# Patient Record
Sex: Male | Born: 1965 | Hispanic: No | Smoking: Never smoker
Health system: Southern US, Community
[De-identification: ages and names within clinical notes are randomized; demographics above are authoritative.]

## PROBLEM LIST (undated history)

## (undated) DIAGNOSIS — I10 Essential (primary) hypertension: Secondary | ICD-10-CM

## (undated) DIAGNOSIS — M1990 Unspecified osteoarthritis, unspecified site: Secondary | ICD-10-CM

## (undated) DIAGNOSIS — G4730 Sleep apnea, unspecified: Secondary | ICD-10-CM

## (undated) DIAGNOSIS — J45909 Unspecified asthma, uncomplicated: Secondary | ICD-10-CM

## (undated) DIAGNOSIS — K219 Gastro-esophageal reflux disease without esophagitis: Secondary | ICD-10-CM

## (undated) DIAGNOSIS — C801 Malignant (primary) neoplasm, unspecified: Secondary | ICD-10-CM

## (undated) DIAGNOSIS — E785 Hyperlipidemia, unspecified: Secondary | ICD-10-CM

## (undated) DIAGNOSIS — N189 Chronic kidney disease, unspecified: Secondary | ICD-10-CM

## (undated) HISTORY — PX: CERVICAL DISC ARTHROPLASTY: SHX587

## (undated) HISTORY — DX: Unspecified asthma, uncomplicated: J45.909

## (undated) HISTORY — DX: Chronic kidney disease, unspecified: N18.9

## (undated) HISTORY — DX: Malignant (primary) neoplasm, unspecified: C80.1

## (undated) HISTORY — PX: KNEE SURGERY: SHX244

## (undated) HISTORY — DX: Hyperlipidemia, unspecified: E78.5

## (undated) HISTORY — PX: OTHER SURGICAL HISTORY: SHX169

## (undated) HISTORY — PX: CHOLECYSTECTOMY: SHX55

---

## 2014-08-25 HISTORY — PX: ESOPHAGOGASTRODUODENOSCOPY: SHX1529

## 2017-01-27 ENCOUNTER — Other Ambulatory Visit: Payer: Self-pay | Admitting: Neurosurgery

## 2017-01-27 DIAGNOSIS — M542 Cervicalgia: Secondary | ICD-10-CM

## 2017-02-03 HISTORY — PX: COLONOSCOPY: SHX174

## 2017-02-05 ENCOUNTER — Inpatient Hospital Stay
Admission: RE | Admit: 2017-02-05 | Discharge: 2017-02-05 | Disposition: A | Discharge status: Home / Self Care | Payer: Self-pay | Source: Ambulatory Visit | Attending: Neurosurgery | Admitting: Neurosurgery

## 2017-06-25 ENCOUNTER — Ambulatory Visit: Payer: Self-pay

## 2017-06-25 ENCOUNTER — Other Ambulatory Visit: Payer: Self-pay | Admitting: Family Medicine

## 2017-06-25 DIAGNOSIS — M545 Low back pain: Secondary | ICD-10-CM

## 2017-07-29 ENCOUNTER — Encounter (INDEPENDENT_AMBULATORY_CARE_PROVIDER_SITE_OTHER): Payer: Self-pay | Admitting: Orthopaedic Surgery

## 2017-07-29 ENCOUNTER — Ambulatory Visit (INDEPENDENT_AMBULATORY_CARE_PROVIDER_SITE_OTHER): Payer: No Typology Code available for payment source | Admitting: Orthopaedic Surgery

## 2017-07-29 VITALS — BP 140/86 | HR 89 | Ht 72.0 in | Wt 305.0 lb

## 2017-07-29 DIAGNOSIS — M545 Low back pain, unspecified: Secondary | ICD-10-CM

## 2017-07-30 ENCOUNTER — Encounter (INDEPENDENT_AMBULATORY_CARE_PROVIDER_SITE_OTHER): Payer: Self-pay | Admitting: Orthopaedic Surgery

## 2017-07-30 NOTE — Progress Notes (Signed)
Office Visit Note   Patient: Sean Townsend           Date of Birth: [birth year]           MRN: [id number] Visit Date: 07/29/2017              Requested by: No referring provider defined for this encounter. PCP: Ignatius Specking, MD   Assessment & Plan: Visit Diagnoses:  1. Acute right-sided low back pain without sciatica     Plan: We will set patient up for some physical therapy I plan to check him back in 6 to 8 weeks.  Continue working.  We discussed a walking program.  Work slip given for no work on 5/3 and 07/21/2017.  Follow-Up Instructions: No follow-ups on file.   Orders:  No orders of the defined types were placed in this encounter.  No orders of the defined types were placed in this encounter.     Procedures: No procedures performed   Clinical Data: No additional findings.   Subjective: Chief Complaint  Patient presents with  . Lower Back - Pain    HPI 52 year old male new back patient concerning a Worker's Comp. injury that occurred on 05/23/2017 at work on [facility] surgical center.  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  Burning pain since that time is continued to work.  He missed 2 days 5/3 and 07/21/2017.  He is taking Robaxin without relief.  Previous history of cervical disc degeneration with cervical disc arthroplasty which is done well.  He denies chills or fever.  Persistent back pain right leg pain.  He is ambulatory without any walking aids.  No associated bowel or bladder symptoms.  He describes the pain in his right leg as a burning type pain.  He has not been through any therapy.  Review of Systems previous C6-7 disc arthroplasty doing well.  Positive for hypertension negative for diabetes and smoking.  Positive for GERD.  Negative for CVA, MI.  Positive for seasonal allergies occasional use of asthma inhaler.  Rest of 14  point review of systems negative as it pertains to HPI.   Objective: Vital Signs: BP 140/86   Pulse 89   Ht 6' (1.829 m)   Wt (!) 305 lb (138.3 kg)   BMI 41.37 kg/m   Physical Exam  Constitutional: He is oriented to person, place, and time. He appears well-developed and well-nourished.  HENT:  Head: Normocephalic and atraumatic.  Eyes: Pupils are equal, round, and reactive to light. EOM are normal.  Neck: No tracheal deviation present. No thyromegaly present.  Cardiovascular: Normal rate.  Pulmonary/Chest: Effort normal. He has no wheezes.  Abdominal: Soft. Bowel sounds are normal.  Neurological: He is alert and oriented to person, place, and time.  Skin: Skin is warm and dry. Capillary refill takes less than 2 seconds.  Psychiatric: He has a normal mood and affect. His behavior is normal. Judgment and thought content normal.    Ortho Exam well-healed cervical disc arthroplasty incision anteriorly.  Upper and lower extremity reflexes are 3+ and symmetrical.  Anterior tib gastrocsoleus intact.  With normal heel and toe walking.  Some pain with straight leg raising on the right positive sciatic notch tenderness negative on the left.  No isolated motor weakness right or left lower extremity quads hamstrings ankle dorsiflexion plantarflexion EHL.  Specialty  Comments:  No specialty comments available.  Imaging: Lumbar x-rays 06/25/2017 showed minimal endplate spurring at L4-5 anteriorly.  No spondylolisthesis negative for fracture.  Disc space height was well-maintained.  Pars are intact.  No acute changes.    PMFS History: There are no active problems to display for this patient.  History reviewed. No pertinent past medical history.  History reviewed. No pertinent family history.  History reviewed. No pertinent surgical history. Social History   Occupational History  . Not on file  Tobacco Use  . Smoking status: Never Smoker  . Smokeless tobacco: Never Used  Substance and  Sexual Activity  . Alcohol use: Not on file  . Drug use: Not on file  . Sexual activity: Not on file

## 2017-09-04 ENCOUNTER — Telehealth (INDEPENDENT_AMBULATORY_CARE_PROVIDER_SITE_OTHER): Payer: Self-pay

## 2017-09-04 DIAGNOSIS — M545 Low back pain, unspecified: Secondary | ICD-10-CM

## 2017-09-04 NOTE — Telephone Encounter (Signed)
Received request for PT rx from [facility] but I didn't see one in chart. Needs to be faxed to [phone] with UJW#[id number] on cover

## 2017-09-05 NOTE — Telephone Encounter (Signed)
Referral entered and faxed

## 2017-09-05 NOTE — Addendum Note (Signed)
Addended by: Rogers SeedsYEATTS, Deira Shimer M on: 09/05/2017 09:07 AM   Modules accepted: Orders

## 2017-09-16 ENCOUNTER — Ambulatory Visit (INDEPENDENT_AMBULATORY_CARE_PROVIDER_SITE_OTHER): Payer: Self-pay | Admitting: Orthopaedic Surgery

## 2020-01-22 IMAGING — DX DG LUMBAR SPINE COMPLETE 4+V
5 series · 5 of 5 positions shown · non-contrast
Comparison: None.

CLINICAL DATA: Pain following lifting heavy object

EXAM:
LUMBAR SPINE - COMPLETE 4+ VIEW

[l-spine ap]
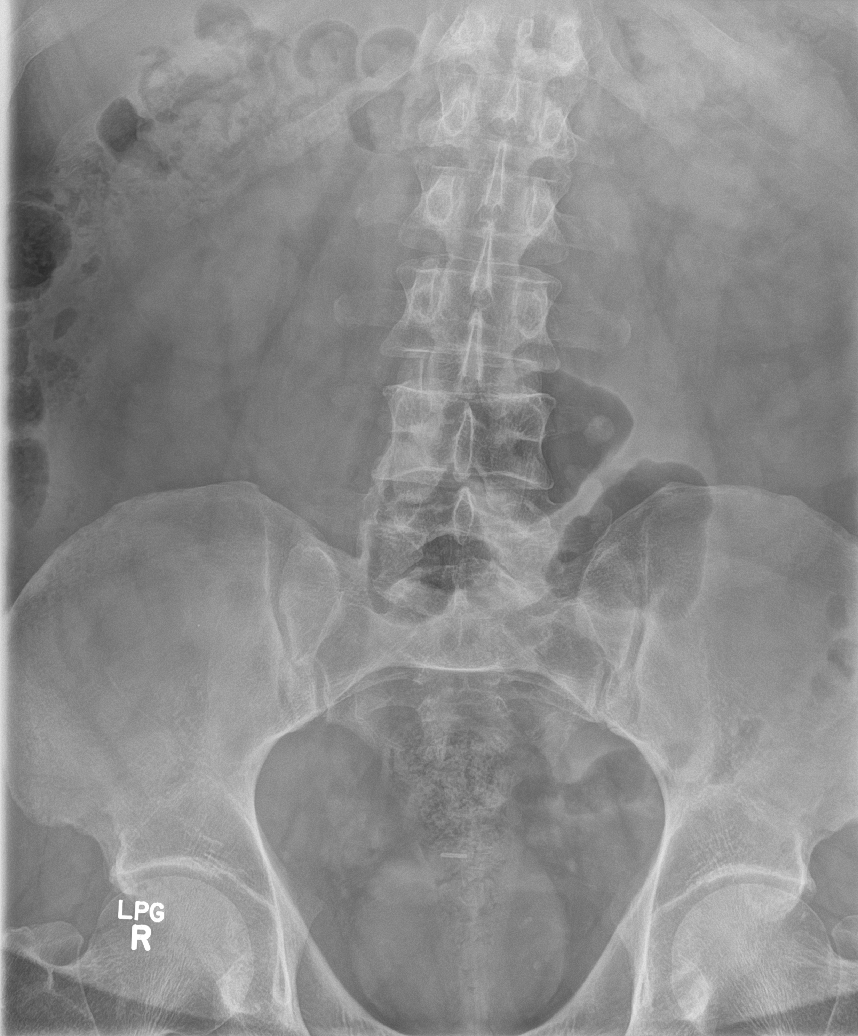

[l-spine obl (1 of 2)]
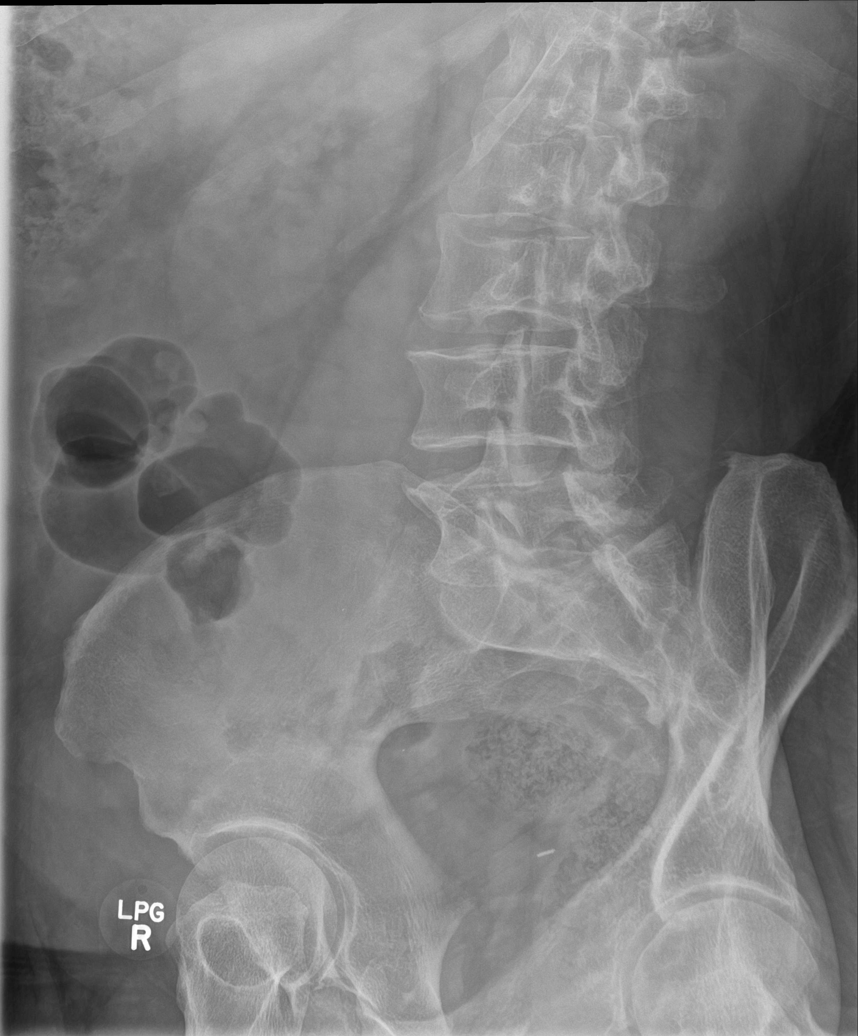

[l-spine obl (2 of 2)]
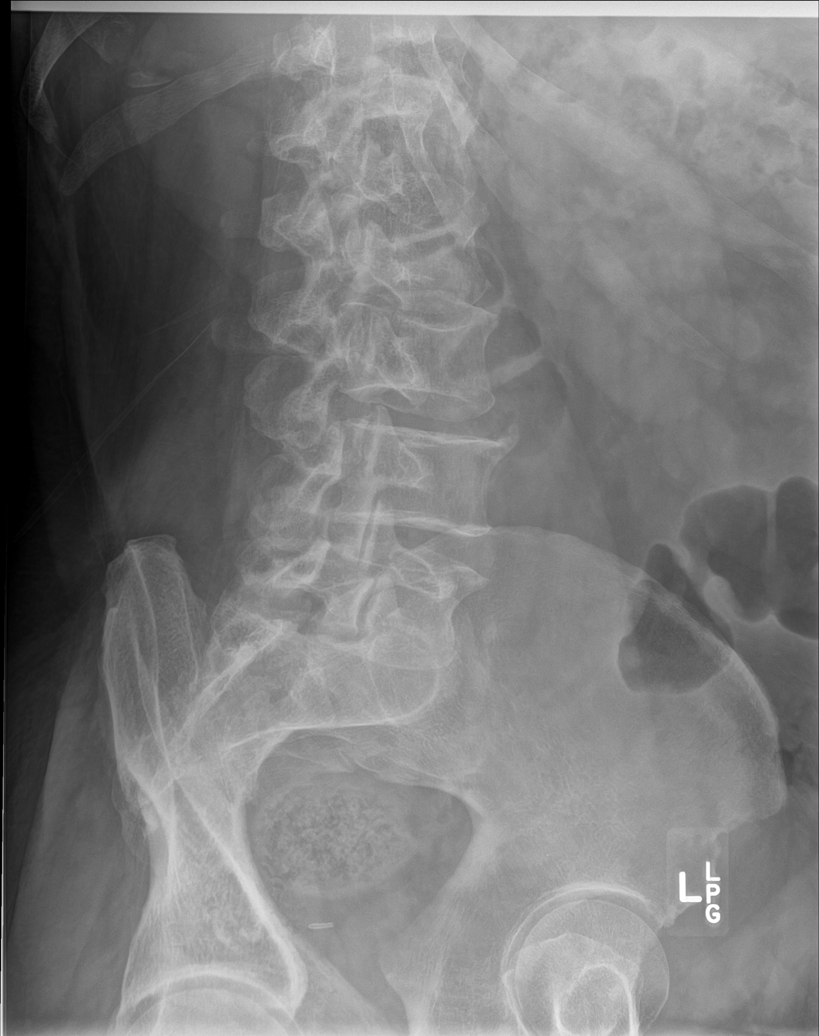

[l-spine lat]
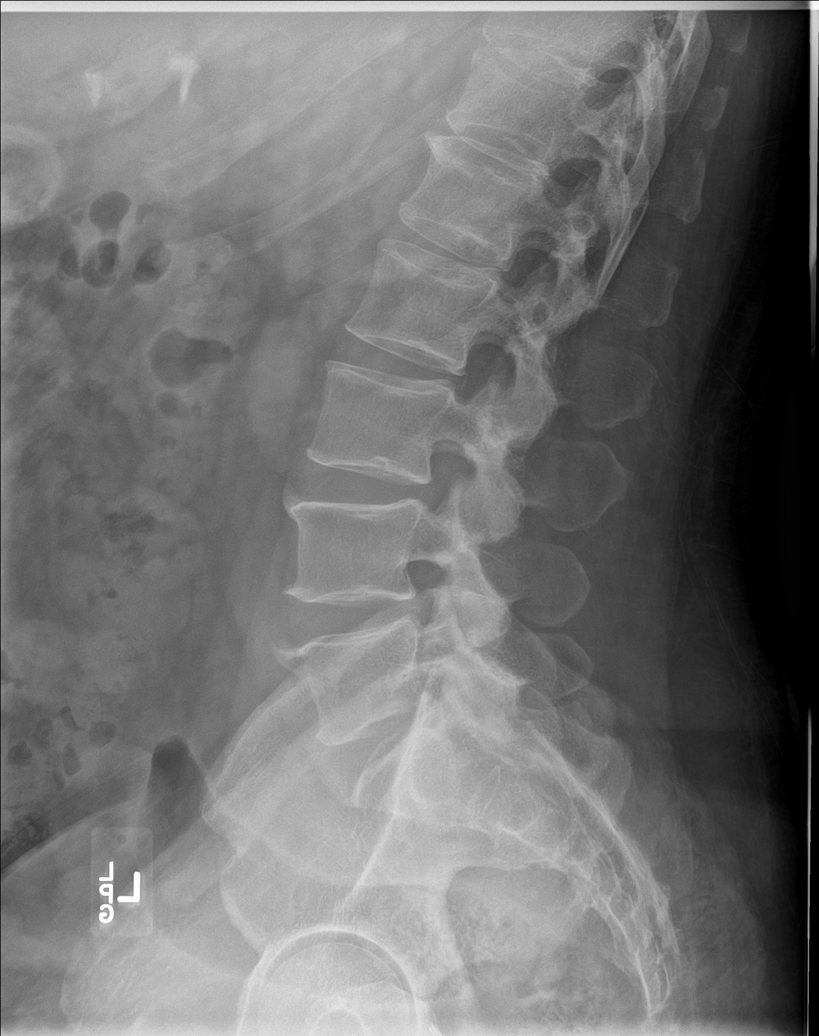

[l-spine spot]
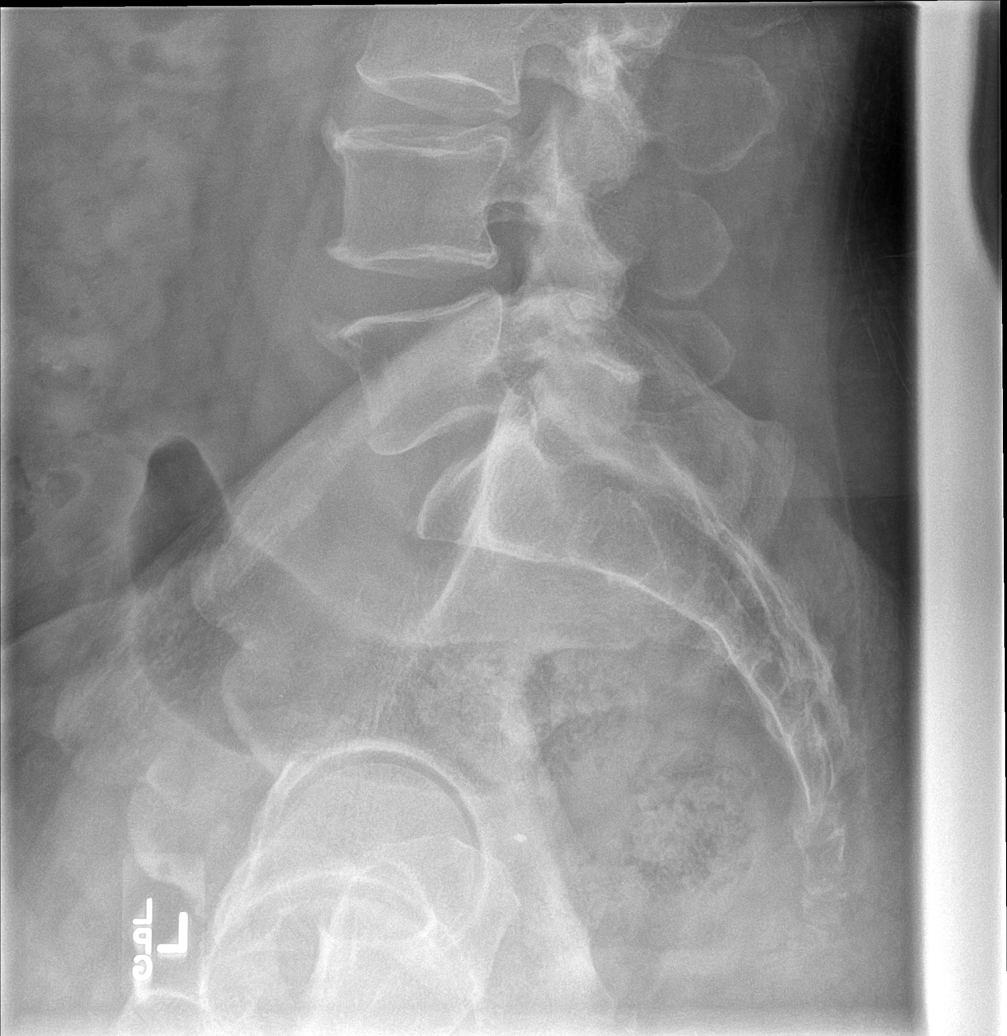

[5 of 5 positions shown; findings below may reference images not displayed]

FINDINGS: Frontal, lateral, spot lumbosacral lateral, and bilateral oblique
views were obtained. There are 5 non-rib-bearing lumbar type
vertebral bodies. No fracture or spondylolisthesis. Disc spaces
appear unremarkable. There are anterior osteophytes at L4 and L5.
There is no appreciable facet arthropathy.
IMPRESSION: No fracture or spondylolisthesis. No appreciable disc space
narrowing or facet arthropathy.

## 2021-03-16 ENCOUNTER — Emergency Department (HOSPITAL_COMMUNITY)
Admission: EM | Admit: 2021-03-16 | Discharge: 2021-03-17 | Disposition: A | Discharge status: Home / Self Care | Payer: Commercial Managed Care - PPO | Attending: Emergency Medicine | Admitting: Emergency Medicine

## 2021-03-16 ENCOUNTER — Other Ambulatory Visit: Payer: Self-pay

## 2021-03-16 DIAGNOSIS — N202 Calculus of kidney with calculus of ureter: Secondary | ICD-10-CM | POA: Diagnosis not present

## 2021-03-16 DIAGNOSIS — D72829 Elevated white blood cell count, unspecified: Secondary | ICD-10-CM | POA: Insufficient documentation

## 2021-03-16 DIAGNOSIS — N200 Calculus of kidney: Secondary | ICD-10-CM

## 2021-03-16 DIAGNOSIS — R109 Unspecified abdominal pain: Secondary | ICD-10-CM | POA: Diagnosis present

## 2021-03-16 LAB — COMPREHENSIVE METABOLIC PANEL
ALT: 23 U/L (ref 0–44)
AST: 20 U/L (ref 15–41)
Albumin: 4.3 g/dL (ref 3.5–5.0)
Alkaline Phosphatase: 53 U/L (ref 38–126)
Anion gap: 9 (ref 5–15)
BUN: 17 mg/dL (ref 6–20)
CO2: 20 mmol/L — ABNORMAL LOW (ref 22–32)
Calcium: 9.6 mg/dL (ref 8.9–10.3)
Chloride: 107 mmol/L (ref 98–111)
Creatinine, Ser: 1.64 mg/dL — ABNORMAL HIGH (ref 0.61–1.24)
GFR, Estimated: 49 mL/min — ABNORMAL LOW (ref >60)
Glucose, Bld: 108 mg/dL — ABNORMAL HIGH (ref 70–99)
Potassium: 3.6 mmol/L (ref 3.5–5.1)
Sodium: 136 mmol/L (ref 135–145)
Total Bilirubin: 1.1 mg/dL (ref 0.3–1.2)
Total Protein: 7.8 g/dL (ref 6.5–8.1)

## 2021-03-16 LAB — URINALYSIS, ROUTINE W REFLEX MICROSCOPIC
Bacteria, UA: NONE SEEN
Bilirubin Urine: NEGATIVE
Glucose, UA: NEGATIVE mg/dL
Ketones, ur: NEGATIVE mg/dL
Nitrite: NEGATIVE
Protein, ur: NEGATIVE mg/dL
Specific Gravity, Urine: 1.01 (ref 1.005–1.030)
pH: 6 (ref 5.0–8.0)

## 2021-03-16 LAB — CBC WITH DIFFERENTIAL/PLATELET
Abs Immature Granulocytes: 0.03 10*3/uL (ref 0.00–0.07)
Basophils Absolute: 0.1 10*3/uL (ref 0.0–0.1)
Basophils Relative: 1 %
Eosinophils Absolute: 0 10*3/uL (ref 0.0–0.5)
Eosinophils Relative: 0 %
HCT: 42 % (ref 39.0–52.0)
Hemoglobin: 14.5 g/dL (ref 13.0–17.0)
Immature Granulocytes: 0 %
Lymphocytes Relative: 16 %
Lymphs Abs: 1.6 10*3/uL (ref 0.7–4.0)
MCH: 31.7 pg (ref 26.0–34.0)
MCHC: 34.5 g/dL (ref 30.0–36.0)
MCV: 91.7 fL (ref 80.0–100.0)
Monocytes Absolute: 1 10*3/uL (ref 0.1–1.0)
Monocytes Relative: 9 %
Neutro Abs: 7.7 10*3/uL (ref 1.7–7.7)
Neutrophils Relative %: 74 %
Platelets: 207 10*3/uL (ref 150–400)
RBC: 4.58 MIL/uL (ref 4.22–5.81)
RDW: 13.1 % (ref 11.5–15.5)
WBC: 10.3 10*3/uL (ref 4.0–10.5)
nRBC: 0 % (ref 0.0–0.2)

## 2021-03-16 LAB — LIPASE, BLOOD: Lipase: 38 U/L (ref 11–51)

## 2021-03-16 MED ORDER — HYDROCODONE-ACETAMINOPHEN 5-325 MG PO TABS
1.0000 | ORAL_TABLET | Freq: Once | ORAL | Status: AC
  Administered 2021-03-16: 21:00:00 1 via ORAL
  Filled 2021-03-16: qty 1

## 2021-03-16 NOTE — ED Triage Notes (Signed)
Pt c/o intermittent left side abdominal pain that radiates to the back ongoing for 4 days associated with nausea. Left flank tenderness on assessment. Denies ETOH, urinary symptoms. Denies GI history. Has taken Fleet Enema and Mirolax with little relief.

## 2021-03-16 NOTE — ED Provider Notes (Signed)
Emergency Medicine Provider Triage Evaluation Note  Sean Townsend , a 55 y.o. male  was evaluated in triage.  Pt complains of left-sided abdominal pain over last 4 days.  Reports his urine has been a little cloudy.  Has taken a Fleet enema and had a bowel movement which was not relieving for him.  Does have a surgical history on the abdomen.  Abdominal pain radiates into the left flank.  No fever or chills  Review of Systems  Positive:  Negative: See above   Physical Exam  BP 135/79    Pulse 98    Temp 100 F (37.8 C)    Resp 16    Ht 6' (1.829 m)    Wt 131.5 kg    SpO2 91%    BMI 39.33 kg/m  Gen:   Awake, no distress   Resp:  Normal effort  MSK:   Moves extremities without difficulty Other:  Minimal flank tenderness  Medical Decision Making  Medically screening exam initiated at 8:27 PM.  Appropriate orders placed.  Sean Townsend was informed that the remainder of the evaluation will be completed by another provider, this initial triage assessment does not replace that evaluation, and the importance of remaining in the ED until their evaluation is complete.     Teressa Lower, PA-C 03/16/21 2029    Rozelle Logan, DO 03/17/21 [phone]

## 2021-03-17 ENCOUNTER — Emergency Department (HOSPITAL_COMMUNITY): Payer: Commercial Managed Care - PPO

## 2021-03-17 MED ORDER — SODIUM CHLORIDE 0.9 % IV BOLUS
1000.0000 mL | Freq: Once | INTRAVENOUS | Status: AC
  Administered 2021-03-17: 1000 mL via INTRAVENOUS

## 2021-03-17 MED ORDER — ONDANSETRON 4 MG PO TBDP: 4.0000 mg | ORAL_TABLET | Freq: Three times a day (TID) | ORAL | 0 refills | Status: AC | PRN

## 2021-03-17 MED ORDER — OXYCODONE-ACETAMINOPHEN 5-325 MG PO TABS: 1.0000 | ORAL_TABLET | Freq: Four times a day (QID) | ORAL | 0 refills | Status: AC | PRN

## 2021-03-17 MED ORDER — ONDANSETRON HCL 4 MG/2ML IJ SOLN
4.0000 mg | Freq: Once | INTRAMUSCULAR | Status: AC
  Administered 2021-03-17: 4 mg via INTRAVENOUS
  Filled 2021-03-17: qty 2

## 2021-03-17 MED ORDER — MORPHINE SULFATE (PF) 4 MG/ML IV SOLN
4.0000 mg | Freq: Once | INTRAVENOUS | Status: AC
  Administered 2021-03-17: 4 mg via INTRAVENOUS
  Filled 2021-03-17: qty 1

## 2021-03-17 NOTE — Discharge Instructions (Signed)
You have a left-sided kidney stone which is 6 mm.  You received IV fluids, IV pain medicine.  I have sent in pain medication to your pharmacy along with Zofran for nausea.  I recommend you take ibuprofen and Tylenol for pain control.  I have attached urology information.  Please call urology office today to schedule yourself a follow-up appointment.  If you have worsening symptoms including fever, uncontrolled pain, uncontrolled nausea vomiting despite taking Zofran please return to the emergency room.

## 2021-03-17 NOTE — ED Provider Notes (Signed)
[facility] Provider Note   CSN: [id number] Arrival date & time: 03/16/21  [id number]     History Chief Complaint  Patient presents with   Abdominal Pain    Sean ArenaKenneth Townsend is a 55 y.o. male.  55 year old male presents today for evaluation of left flank pain about 1 week duration that has been progressively worsening over the past 4 days.  Denies previous history of kidney stones.  Reports he has been treating himself with Fleet enemas and bowel regimen without much success.  Reports lack of appetite over the past 4 days as well.  States he started himself on a soft food and liquid diet because he thought he might be needed.  He denies fever, chills, hematuria, dysuria.   The history is provided by the patient. No language interpreter was used.  Abdominal Pain Associated symptoms: no chills, no dysuria, no fever and no hematuria       No past medical history on file.  There are no problems to display for this patient.   No past surgical history on file.     No family history on file.  Social History   Tobacco Use   Smoking status: Never   Smokeless tobacco: Never    Home Medications Prior to Admission medications   Medication Sig Start Date End Date Taking? Authorizing Provider  Albuterol Sulfate 108 (90 Base) MCG/ACT AEPB Inhale into the lungs.    [provider]  amLODipine-benazepril (LOTREL) 5-20 MG capsule  09/11/16   [provider]  cetirizine (ZYRTEC) 10 MG tablet Take by mouth.    [provider]  Choline Fenofibrate (FENOFIBRIC ACID) 135 MG CPDR  02/05/15   [provider]  FLUoxetine (PROZAC) 20 MG capsule  08/25/16   [provider]  fluticasone (FLONASE) 50 MCG/ACT nasal spray Place into the nose.    [provider]  methocarbamol (ROBAXIN) 750 MG tablet TAKE 1 TABLET BY MOUTH EVERY 8 HOURS AS NEEDED FOR MUSCLE TIGHTNESS 06/25/17   [provider]  omeprazole  (PRILOSEC) 40 MG capsule  05/10/17   [provider]    Allergies    Nystatin  Review of Systems   Review of Systems  Constitutional:  Negative for chills and fever.  Gastrointestinal:  Positive for abdominal pain.  Genitourinary:  Positive for flank pain. Negative for difficulty urinating, dysuria and hematuria.  Neurological:  Negative for weakness and light-headedness.  All other systems reviewed and are negative.  Physical Exam Updated Vital Signs BP 114/85    Pulse 69    Temp 99 F (37.2 C) (Oral)    Resp 16    Ht 6' (1.829 m)    Wt 131.5 kg    SpO2 97%    BMI 39.33 kg/m   Physical Exam Vitals and nursing note reviewed.  Constitutional:      General: He is not in acute distress.    Appearance: Normal appearance. He is not ill-appearing.  HENT:     Head: Normocephalic and atraumatic.     Nose: Nose normal.  Eyes:     Conjunctiva/sclera: Conjunctivae normal.  Cardiovascular:     Rate and Rhythm: Normal rate and regular rhythm.  Pulmonary:     Effort: Pulmonary effort is normal. No respiratory distress.     Breath sounds: Normal breath sounds. No wheezing or rales.  Abdominal:     General: There is no distension.     Palpations: Abdomen is soft.  Tenderness: There is abdominal tenderness. There is left CVA tenderness. There is no right CVA tenderness or guarding.  Musculoskeletal:        General: No deformity. Normal range of motion.     Cervical back: Normal range of motion.  Skin:    Findings: No rash.  Neurological:     Mental Status: He is alert.    ED Results / Procedures / Treatments   Labs (all labs ordered are listed, but only abnormal results are displayed) Labs Reviewed  COMPREHENSIVE METABOLIC PANEL - Abnormal; Notable for the following components:      Result Value   CO2 20 (*)    Glucose, Bld 108 (*)    Creatinine, Ser 1.64 (*)    GFR, Estimated 49 (*)    All other components within normal limits  URINALYSIS, ROUTINE W REFLEX  MICROSCOPIC - Abnormal; Notable for the following components:   Hgb urine dipstick LARGE (*)    Leukocytes,Ua TRACE (*)    All other components within normal limits  LIPASE, BLOOD  CBC WITH DIFFERENTIAL/PLATELET    EKG None  Radiology CT Renal Stone Study  Result Date: 03/17/2021 CLINICAL DATA:  Left flank pain. EXAM: CT ABDOMEN AND PELVIS WITHOUT CONTRAST TECHNIQUE: Multidetector CT imaging of the abdomen and pelvis was performed following the standard protocol without IV contrast. COMPARISON:  None. FINDINGS: Lower chest: No acute abnormality. Hepatobiliary: No focal liver abnormality is seen. Status post cholecystectomy. No biliary dilatation. Pancreas: Unremarkable. No pancreatic ductal dilatation or surrounding inflammatory changes. Spleen: Normal in size without focal abnormality. Adrenals/Urinary Tract: Adrenal glands are unremarkable. Kidneys are normal in size, without focal lesions. A 2 mm nonobstructing renal calculus is seen within the posterior aspect of the mid right kidney. A 6 mm obstructing renal calculus is seen within the proximal left ureter, with mild left-sided hydronephrosis and hydroureter. Bladder is unremarkable. Stomach/Bowel: Stomach is within normal limits. Appendix appears normal. No evidence of bowel wall thickening, distention, or inflammatory changes. Vascular/Lymphatic: No significant vascular findings are present. No enlarged abdominal or pelvic lymph nodes. Reproductive: Prostate is unremarkable. Other: No abdominal wall hernia or abnormality. No abdominopelvic ascites. Musculoskeletal: No acute or significant osseous findings. IMPRESSION: 1. 6 mm obstructing renal calculus within the proximal left ureter. 2. 2 mm nonobstructing right renal calculus. 3. Evidence of prior cholecystectomy. Electronically Signed   By: Virgina Norfolk M.D.   On: 03/17/2021 01:54    Procedures Procedures   Medications Ordered in ED Medications  morphine 4 MG/ML injection 4 mg  (has no administration in time range)  ondansetron (ZOFRAN) injection 4 mg (has no administration in time range)  sodium chloride 0.9 % bolus 1,000 mL (has no administration in time range)  HYDROcodone-acetaminophen (NORCO/VICODIN) 5-325 MG per tablet 1 tablet (1 tablet Oral Given 03/16/21 2059)    ED Course  I have reviewed the triage vital signs and the nursing notes.  Pertinent labs & imaging results that were available during my care of the patient were reviewed by me and considered in my medical decision making (see chart for details).    MDM Rules/Calculators/A&P                         This patient presents to the ED for concern of left flank pain and LLQ abdominal pain, this involves an extensive number of treatment options, and is a complaint that carries with it a high risk of complications and morbidity.  The differential diagnosis  includes nephrolithiasis, diverticulitis, pancreatitis, other acute intra-abdominal process.   Additional history obtained:  Additional history obtained from sister who was at bedside   Lab Tests:  I Ordered, and personally interpreted labs.  The pertinent results include: Unremarkable CBC, creatinine 1.64 otherwise without concerning findings, UA with hemoglobin and trace leukocytes, and lipase of 38.   Imaging Studies ordered:  I ordered imaging studies including CT renal study I independently visualized and interpreted imaging which showed left 6 mm obstructing renal stone in the proximal ureter, and 2 mm right nonobstructing nephrolithiasis I agree with the radiologist interpretation   Medicines ordered and prescription drug management:  I ordered medication including morphine, Zofran, normal saline for pain control and IV hydration Reevaluation of the patient after these medicines showed that the patient improved I have reviewed the patients home medicines and have made adjustments as needed    Reevaluation:  After the  interventions noted above, I reevaluated the patient and found that they have :improved   Dispostion:  After consideration of the diagnostic results and the patients response to treatment, I feel that the patent would benefit from pain management and follow-up with urology.  Discussed importance of calling urology office today following discharge and scheduling a clinic follow-up appointment.  We will provide patient with Percocet to keep on hand for breakthrough pain.  Discussed symptomatic management and return precautions.  Patient voices understanding and is in agreement with plan.     Final Clinical Impression(s) / ED Diagnoses Final diagnoses:  Nephrolithiasis    Rx / DC Orders ED Discharge Orders          Ordered    oxyCODONE-acetaminophen (PERCOCET/ROXICET) 5-325 MG tablet  Every 6 hours PRN        03/17/21 1052    ondansetron (ZOFRAN-ODT) 4 MG disintegrating tablet  Every 8 hours PRN        03/17/21 1052             Marita Kansas, PA-C 03/17/21 1053    Arby Barrette, MD 03/21/21 [id number]

## 2021-03-17 NOTE — ED Notes (Signed)
Pt opened registration door asking to speak to nursing supervisor. Pt was told that he does not have access to door because of staff safety. Charge nurse notified.

## 2021-03-23 ENCOUNTER — Encounter (HOSPITAL_BASED_OUTPATIENT_CLINIC_OR_DEPARTMENT_OTHER): Payer: Self-pay | Admitting: Urology

## 2021-03-23 ENCOUNTER — Other Ambulatory Visit: Payer: Self-pay | Admitting: Urology

## 2021-03-23 NOTE — Progress Notes (Signed)
Left voice mail to return call for instructions 

## 2021-03-23 NOTE — Progress Notes (Signed)
Pt returned call. Instructions given. Meds and hx reviewed. Driver secured. Cl liquids until 0600 arrival time 0800

## 2021-03-26 ENCOUNTER — Encounter (HOSPITAL_BASED_OUTPATIENT_CLINIC_OR_DEPARTMENT_OTHER): Admission: RE | Payer: Self-pay | Source: Ambulatory Visit

## 2021-03-26 ENCOUNTER — Ambulatory Visit (HOSPITAL_BASED_OUTPATIENT_CLINIC_OR_DEPARTMENT_OTHER): Admission: RE | Admit: 2021-03-26 | Payer: Commercial Managed Care - PPO | Source: Ambulatory Visit | Admitting: Urology

## 2021-03-26 HISTORY — DX: Gastro-esophageal reflux disease without esophagitis: K21.9

## 2021-03-26 HISTORY — DX: Essential (primary) hypertension: I10

## 2021-03-26 HISTORY — DX: Unspecified osteoarthritis, unspecified site: M19.90

## 2021-03-26 HISTORY — DX: Sleep apnea, unspecified: G47.30

## 2021-03-26 SURGERY — LITHOTRIPSY, ESWL
Anesthesia: LOCAL | Laterality: Left

## 2022-06-18 ENCOUNTER — Telehealth: Payer: Self-pay | Admitting: Gastroenterology

## 2022-06-18 NOTE — Telephone Encounter (Signed)
Received partial records from Dr. Liliane Channel office, need path and colon reports from 2019 and 2023. Awaiting records. Stat fax request sent 4/2. Will await. Patient requesting dr. Lyndel Safe per dr. Earlean Shawl retiring.

## 2022-07-02 NOTE — Telephone Encounter (Signed)
Good afternoon Dr. Chales Abrahams,   We received a call from this patient, wishing to establish GI care with you due to Dr. Kinnie Scales retiring. Last had colonoscopy in 2023 and was recommended a 5 year interval. Records were received and scanned into Media for your review. Would you please review and advise on scheduling.

## 2022-07-02 NOTE — Telephone Encounter (Signed)
OK to schedule colonoscopy directly with me (RE: history of polyps) Please have colonoscopy report from 2019 available - pl get it from [facility] [place] [facility])  RG

## 2022-07-10 ENCOUNTER — Encounter: Payer: Self-pay | Admitting: Gastroenterology

## 2022-07-10 NOTE — Telephone Encounter (Signed)
Spoke with Victorino Dike at [facility], she stated patient's last colonoscopy was in 2018, patient did not have colon in 2019 or any since then. Would fax reports over. Records will also be scanned into Media once received.

## 2022-08-13 ENCOUNTER — Ambulatory Visit (AMBULATORY_SURGERY_CENTER): Payer: Medicare Other | Admitting: *Deleted

## 2022-08-13 VITALS — Ht 72.0 in | Wt 300.0 lb

## 2022-08-13 DIAGNOSIS — Z86010 Personal history of colonic polyps: Secondary | ICD-10-CM

## 2022-08-13 MED ORDER — NA SULFATE-K SULFATE-MG SULF 17.5-3.13-1.6 GM/177ML PO SOLN: 1.0000 | Freq: Once | ORAL | 0 refills | Status: AC

## 2022-08-13 NOTE — Progress Notes (Signed)
Pt's name and DOB verified at the beginning of the pre-visit.  Pt denies any difficulty with ambulating,sitting, laying down or rolling side to side Gave both [facility] main # and MD on call # prior to instructions.  No egg or soy allergy known to patient  No issues known to pt with past sedation with any surgeries or procedures Pt denies having issues being intubated Patient denies ever being intubated Pt has no issues moving head neck or swallowing No FH of Malignant Hyperthermia Pt is not on diet pills Pt is not on home 02  Pt is not on blood thinners  Pt denies issues with constipation  Pt is not on dialysis Pt denise any abnormal heart rhythms  Pt denies any upcoming cardiac testing Pt encouraged to use to use Singlecare or Goodrx to reduce cost  Patient's chart reviewed by Cathlyn Parsons CNRA prior to pre-visit and patient appropriate for the [facility].  Pre-visit completed and red dot placed by patient's name on their procedure day (on provider's schedule).  . Visit by phone Pt states weight is 300 Instructed pt why it is important to and  to call if they have any changes in health or new medications. Directed them to the # given and on instructions.   Pt states they will.  Instructions reviewed with pt and pt states understanding. Instructed to review again prior to procedure. Pt states they will.  Instructions sent by mail with coupon and by my chart

## 2022-08-27 ENCOUNTER — Encounter: Payer: Self-pay | Admitting: Gastroenterology

## 2022-09-11 ENCOUNTER — Encounter: Payer: Self-pay | Admitting: Gastroenterology

## 2022-09-11 ENCOUNTER — Ambulatory Visit (AMBULATORY_SURGERY_CENTER): Payer: Medicare Other | Admitting: Gastroenterology

## 2022-09-11 VITALS — BP 138/87 | HR 72 | Temp 98.4°F | Resp 13 | Ht 72.0 in | Wt 300.0 lb

## 2022-09-11 DIAGNOSIS — D125 Benign neoplasm of sigmoid colon: Secondary | ICD-10-CM

## 2022-09-11 DIAGNOSIS — D128 Benign neoplasm of rectum: Secondary | ICD-10-CM | POA: Diagnosis not present

## 2022-09-11 DIAGNOSIS — Z09 Encounter for follow-up examination after completed treatment for conditions other than malignant neoplasm: Secondary | ICD-10-CM | POA: Diagnosis not present

## 2022-09-11 DIAGNOSIS — K635 Polyp of colon: Secondary | ICD-10-CM | POA: Diagnosis not present

## 2022-09-11 DIAGNOSIS — Z86010 Personal history of colonic polyps: Secondary | ICD-10-CM | POA: Diagnosis not present

## 2022-09-11 DIAGNOSIS — D127 Benign neoplasm of rectosigmoid junction: Secondary | ICD-10-CM | POA: Diagnosis not present

## 2022-09-11 MED ORDER — SODIUM CHLORIDE 0.9 % IV SOLN: 500.0000 mL | Freq: Once | INTRAVENOUS | Status: DC

## 2022-09-11 NOTE — Progress Notes (Signed)
Pt's states no medical or surgical changes since previsit or office visit. 

## 2022-09-11 NOTE — Progress Notes (Signed)
[facility] History and Physical   Primary Care Physician:  Ignatius Specking, MD   Reason for Procedure:   H/O polyps, neg colon 2018  Plan:    colon     HPI: Sean Townsend is a 57 y.o. adult    Past Medical History:  Diagnosis Date   Arthritis    Asthma    Cancer (HCC)    SKin   Chronic kidney disease    Kidney stones   GERD (gastroesophageal reflux disease)    Hyperlipidemia    Hypertension    Sleep apnea     Past Surgical History:  Procedure Laterality Date   CERVICAL DISC ARTHROPLASTY     C 6-7   CHOLECYSTECTOMY     Lap   COLONOSCOPY  02/03/2017   Dr Kinnie Scales.  No neoplams. Normal ileoscopy examination. Normal colonoscopic examination. Internal hemorrhoids Appearancec Mild to Moderate.  Repeat colonoscopy in 5 years   ESOPHAGOGASTRODUODENOSCOPY  08/25/2014   Dr Kinnie Scales. Pan endoscopy. Normal endoscopy   KNEE SURGERY     x 11   tonselectomy      Prior to Admission medications   Medication Sig Start Date End Date Taking? Authorizing Provider  Albuterol Sulfate 108 (90 Base) MCG/ACT AEPB Inhale into the lungs.   Yes [provider]  amLODipine-benazepril (LOTREL) 5-20 MG capsule  09/11/16  Yes [provider]  Cannabinoids (THC FREE PO)  04/18/22  Yes [provider]  cetirizine (ZYRTEC) 10 MG tablet Take by mouth.   Yes [provider]  FLUoxetine (PROZAC) 20 MG capsule  08/25/16  Yes [provider]  gemfibrozil (LOPID) 600 MG tablet Take 600 mg by mouth 2 (two) times daily. 06/25/22  Yes [provider]  omeprazole (PRILOSEC) 40 MG capsule  05/10/17  Yes [provider]  Probiotic Product ([employer] DAILY PROBIOTIC) CAPS  04/18/22  Yes [provider]  fluticasone (FLONASE) 50 MCG/ACT nasal spray Place into the nose.    [provider]  methocarbamol (ROBAXIN) 750 MG tablet TAKE 1 TABLET BY MOUTH EVERY 8 HOURS AS NEEDED FOR MUSCLE TIGHTNESS 06/25/17   [provider]  Multiple  Vitamins-Minerals (CENTRUM SILVER 50+MEN) TABS     [provider]  ondansetron (ZOFRAN-ODT) 4 MG disintegrating tablet Take 1 tablet (4 mg total) by mouth every 8 (eight) hours as needed for nausea or vomiting. 03/17/21   Marita Kansas, PA-C  oxyCODONE-acetaminophen (PERCOCET/ROXICET) 5-325 MG tablet Take 1 tablet by mouth every 6 (six) hours as needed for severe pain.    [provider]  SEMAGLUTIDE,0.25 OR 0.5MG /DOS, [facility]  07/16/22   [provider]  tamsulosin (FLOMAX) 0.4 MG CAPS capsule Take 0.4 mg by mouth.    [provider]    Current Outpatient Medications  Medication Sig Dispense Refill   Albuterol Sulfate 108 (90 Base) MCG/ACT AEPB Inhale into the lungs.     amLODipine-benazepril (LOTREL) 5-20 MG capsule      Cannabinoids (THC FREE PO)      cetirizine (ZYRTEC) 10 MG tablet Take by mouth.     FLUoxetine (PROZAC) 20 MG capsule      gemfibrozil (LOPID) 600 MG tablet Take 600 mg by mouth 2 (two) times daily.     omeprazole (PRILOSEC) 40 MG capsule      Probiotic Product ([employer] DAILY PROBIOTIC) CAPS      fluticasone (FLONASE) 50 MCG/ACT nasal spray Place into the nose.     methocarbamol (ROBAXIN) 750 MG tablet TAKE 1 TABLET BY MOUTH  EVERY 8 HOURS AS NEEDED FOR MUSCLE TIGHTNESS  0   Multiple Vitamins-Minerals (CENTRUM SILVER 50+MEN) TABS      ondansetron (ZOFRAN-ODT) 4 MG disintegrating tablet Take 1 tablet (4 mg total) by mouth every 8 (eight) hours as needed for nausea or vomiting. 20 tablet 0   oxyCODONE-acetaminophen (PERCOCET/ROXICET) 5-325 MG tablet Take 1 tablet by mouth every 6 (six) hours as needed for severe pain.     SEMAGLUTIDE,0.25 OR 0.5MG /DOS, [facility]      tamsulosin (FLOMAX) 0.4 MG CAPS capsule Take 0.4 mg by mouth.     Current Facility-Administered Medications  Medication Dose Route Frequency Provider Last Rate Last Admin   0.9 %  sodium chloride infusion  500 mL Intravenous Once Lynann Bologna, MD        Allergies as of 09/11/2022 - Review  Complete 09/11/2022  Allergen Reaction Noted   Niacin Other (See Comments) 03/23/2021   Statins Other (See Comments) 08/13/2022    Family History  Problem Relation Age of Onset   Colon polyps Mother    Colon cancer Neg Hx    Esophageal cancer Neg Hx    Stomach cancer Neg Hx    Rectal cancer Neg Hx     Social History   Socioeconomic History   Marital status: Divorced    Spouse name: Not on file   Number of children: Not on file   Years of education: Not on file   Highest education level: Not on file  Occupational History   Not on file  Tobacco Use   Smoking status: Never   Smokeless tobacco: Never  Substance and Sexual Activity   Alcohol use: Never   Drug use: Never   Sexual activity: Not on file  Other Topics Concern   Not on file  Social History Narrative   Not on file   Social Determinants of Health   Financial Resource Strain: Not on file  Food Insecurity: Not on file  Transportation Needs: Not on file  Physical Activity: Not on file  Stress: Not on file  Social Connections: Not on file  Intimate Partner Violence: Not on file    Review of Systems: Positive for none All other review of systems negative except as mentioned in the HPI.  Physical Exam: Vital signs in last 24 hours: @VSRANGES @   General:   Alert,  Well-developed, well-nourished, pleasant and cooperative in NAD Lungs:  Clear throughout to auscultation.   Heart:  Regular rate and rhythm; no murmurs, clicks, rubs,  or gallops. Abdomen:  Soft, nontender and nondistended. Normal bowel sounds.   Neuro/Psych:  Alert and cooperative. Normal mood and affect. A and O x 3    No significant changes were identified.  The patient continues to be an appropriate candidate for the planned procedure and anesthesia.   Edman Circle, MD. [facility] 09/11/2022 10:13 AM@

## 2022-09-11 NOTE — Op Note (Signed)
[facility] Patient Name: Sean Townsend Procedure Date: 09/11/2022 10:14 AM MRN: [id number] Endoscopist: Lynann Bologna , MD, [id number] Age: 57 Referring MD:  Date of Birth: Feb 21, 1966 Gender: Male Account #: [id number] Procedure:                Colonoscopy Indications:              High risk colon cancer surveillance: Personal                            history of colonic polyps Medicines:                Monitored Anesthesia Care Procedure:                Pre-Anesthesia Assessment:                           - Prior to the procedure, a History and Physical                            was performed, and patient medications and                            allergies were reviewed. The patient's tolerance of                            previous anesthesia was also reviewed. The risks                            and benefits of the procedure and the sedation                            options and risks were discussed with the patient.                            All questions were answered, and informed consent                            was obtained. Prior Anticoagulants: The patient has                            taken no anticoagulant or antiplatelet agents. ASA                            Grade Assessment: II - A patient with mild systemic                            disease. After reviewing the risks and benefits,                            the patient was deemed in satisfactory condition to                            undergo the procedure.  After obtaining informed consent, the colonoscope                            was passed under direct vision. Throughout the                            procedure, the patient's blood pressure, pulse, and                            oxygen saturations were monitored continuously. The                            Olympus CF-HQ190L [id number]) Colonoscope was                            introduced through the anus and advanced to  the 2                            cm into the ileum. The colonoscopy was performed                            without difficulty. The patient tolerated the                            procedure well. The quality of the bowel                            preparation was good. The terminal ileum, ileocecal                            valve, appendiceal orifice, and rectum were                            photographed. Scope In: 10:22:05 AM Scope Out: 10:34:34 AM Scope Withdrawal Time: 0 hours 9 minutes 49 seconds  Total Procedure Duration: 0 hours 12 minutes 29 seconds  Findings:                 Three sessile polyps were found in the proximal                            rectum, mid rectum and mid sigmoid colon. The                            polyps were 4 to 6 mm in size. These polyps were                            removed with a cold snare. Resection and retrieval                            were complete.                           Rare small-mouthed diverticula were found in the  sigmoid colon.                           Non-bleeding internal hemorrhoids were found during                            retroflexion. The hemorrhoids were small and Grade                            I (internal hemorrhoids that do not prolapse).                            Anorectal scar was noted consistent with previous                            hemorrhoidal banding. Also noted was healed                            posterior anal fissure.                           The terminal ileum appeared normal.                           The exam was otherwise without abnormality on                            direct and retroflexion views. Complications:            No immediate complications. Estimated Blood Loss:     Estimated blood loss: none. Impression:               - Three 4 to 6 mm polyps in the proximal rectum, in                            the mid rectum and in the mid sigmoid colon,                             removed with a cold snare. Resected and retrieved.                           - Minimal sigmoid diverticulosis                           - Non-bleeding internal hemorrhoids.                           - The examined portion of the ileum was normal.                           - The examination was otherwise normal on direct                            and retroflexion views. Recommendation:           - Patient has a contact number available for  emergencies. The signs and symptoms of potential                            delayed complications were discussed with the                            patient. Return to normal activities tomorrow.                            Written discharge instructions were provided to the                            patient.                           - High fiber diet.                           - Continue present medications.                           - Await pathology results.                           - Repeat colonoscopy for surveillance based on                            pathology results.                           - The findings and recommendations were discussed                            with the patient's family. Lynann Bologna, MD 09/11/2022 10:38:12 AM This report has been signed electronically.

## 2022-09-11 NOTE — Progress Notes (Signed)
Uneventful anesthetic. Report to pacu rn. Vss. Care resumed by rn. 

## 2022-09-11 NOTE — Progress Notes (Signed)
Called to room to assist during endoscopic procedure.  Patient ID and intended procedure confirmed with present staff. Received instructions for my participation in the procedure from the performing physician.  

## 2022-09-11 NOTE — Patient Instructions (Signed)
Await pathology results.  Continue present medications.   Resume previous diet.  Handouts on polyps, diverticulosis, hemorrhoids, and high fiber diet provided.  YOU HAD AN ENDOSCOPIC PROCEDURE TODAY AT THE [facility]:   Refer to the procedure report that was given to you for any specific questions about what was found during the examination.  If the procedure report does not answer your questions, please call your gastroenterologist to clarify.  If you requested that your care partner not be given the details of your procedure findings, then the procedure report has been included in a sealed envelope for you to review at your convenience later.  YOU SHOULD EXPECT: Some feelings of bloating in the abdomen. Passage of more gas than usual.  Walking can help get rid of the air that was put into your GI tract during the procedure and reduce the bloating. If you had a lower endoscopy (such as a colonoscopy or flexible sigmoidoscopy) you may notice spotting of blood in your stool or on the toilet paper. If you underwent a bowel prep for your procedure, you may not have a normal bowel movement for a few days.  Please Note:  You might notice some irritation and congestion in your nose or some drainage.  This is from the oxygen used during your procedure.  There is no need for concern and it should clear up in a day or so.  SYMPTOMS TO REPORT IMMEDIATELY:  Following lower endoscopy (colonoscopy or flexible sigmoidoscopy):  Excessive amounts of blood in the stool  Significant tenderness or worsening of abdominal pains  Swelling of the abdomen that is new, acute  Fever of 100F or higher   For urgent or emergent issues, a gastroenterologist can be reached at any hour by calling [phone]. Do not use MyChart messaging for urgent concerns.    DIET:  We do recommend a small meal at first, but then you may proceed to your regular diet.  Drink plenty of fluids but you should avoid  alcoholic beverages for 24 hours.  ACTIVITY:  You should plan to take it easy for the rest of today and you should NOT DRIVE or use heavy machinery until tomorrow (because of the sedation medicines used during the test).    FOLLOW UP: Our staff will call the number listed on your records the next business day following your procedure.  We will call around 7:15- 8:00 am to check on you and address any questions or concerns that you may have regarding the information given to you following your procedure. If we do not reach you, we will leave a message.     If any biopsies were taken you will be contacted by phone or by letter within the next 1-3 weeks.  Please call us at [phone] if you have not heard about the biopsies in 3 weeks.    SIGNATURES/CONFIDENTIALITY: You and/or your care partner have signed paperwork which will be entered into your electronic medical record.  These signatures attest to the fact that that the information above on your After Visit Summary has been reviewed and is understood.  Full responsibility of the confidentiality of this discharge information lies with you and/or your care-partner.

## 2022-09-12 ENCOUNTER — Telehealth: Payer: Self-pay

## 2022-09-12 NOTE — Telephone Encounter (Signed)
  Follow up Call-     09/11/2022    9:34 AM  Call back number  Post procedure Call Back phone  # [phone]  Permission to leave phone message Yes     Patient questions:  Do you have a fever, pain , or abdominal swelling? No. Pain Score  0 *  Have you tolerated food without any problems? Yes.    Have you been able to return to your normal activities? Yes.    Do you have any questions about your discharge instructions: Diet   No. Medications  No. Follow up visit  No.  Do you have questions or concerns about your Care? No.  Actions: * If pain score is 4 or above: No action needed, pain <4.

## 2022-09-27 ENCOUNTER — Encounter: Payer: Self-pay | Admitting: Gastroenterology

## 2023-11-26 ENCOUNTER — Other Ambulatory Visit: Payer: Self-pay

## 2023-11-26 ENCOUNTER — Emergency Department (HOSPITAL_COMMUNITY)
Admission: EM | Admit: 2023-11-26 | Discharge: 2023-11-26 | Disposition: A | Discharge status: Home / Self Care | Attending: Emergency Medicine | Admitting: Emergency Medicine

## 2023-11-26 ENCOUNTER — Encounter (HOSPITAL_COMMUNITY): Payer: Self-pay | Admitting: Emergency Medicine

## 2023-11-26 ENCOUNTER — Emergency Department (HOSPITAL_COMMUNITY)

## 2023-11-26 DIAGNOSIS — Z7951 Long term (current) use of inhaled steroids: Secondary | ICD-10-CM | POA: Insufficient documentation

## 2023-11-26 DIAGNOSIS — I10 Essential (primary) hypertension: Secondary | ICD-10-CM | POA: Insufficient documentation

## 2023-11-26 DIAGNOSIS — M795 Residual foreign body in soft tissue: Secondary | ICD-10-CM

## 2023-11-26 DIAGNOSIS — S60457A Superficial foreign body of left little finger, initial encounter: Secondary | ICD-10-CM | POA: Insufficient documentation

## 2023-11-26 DIAGNOSIS — Z87442 Personal history of urinary calculi: Secondary | ICD-10-CM | POA: Insufficient documentation

## 2023-11-26 DIAGNOSIS — Z79899 Other long term (current) drug therapy: Secondary | ICD-10-CM | POA: Insufficient documentation

## 2023-11-26 DIAGNOSIS — Z23 Encounter for immunization: Secondary | ICD-10-CM | POA: Diagnosis not present

## 2023-11-26 DIAGNOSIS — W450XXA Nail entering through skin, initial encounter: Secondary | ICD-10-CM | POA: Insufficient documentation

## 2023-11-26 DIAGNOSIS — J45909 Unspecified asthma, uncomplicated: Secondary | ICD-10-CM | POA: Diagnosis not present

## 2023-11-26 MED ORDER — LIDOCAINE HCL (PF) 1 % IJ SOLN
5.0000 mL | Freq: Once | INTRAMUSCULAR | Status: AC
  Administered 2023-11-26: 5 mL
  Filled 2023-11-26: qty 30

## 2023-11-26 MED ORDER — ACETAMINOPHEN 500 MG PO TABS
1000.0000 mg | ORAL_TABLET | Freq: Once | ORAL | Status: AC
  Administered 2023-11-26: 1000 mg via ORAL
  Filled 2023-11-26: qty 2

## 2023-11-26 MED ORDER — IBUPROFEN 200 MG PO TABS
400.0000 mg | ORAL_TABLET | Freq: Once | ORAL | Status: AC
  Administered 2023-11-26: 400 mg via ORAL
  Filled 2023-11-26: qty 2

## 2023-11-26 MED ORDER — CEPHALEXIN 500 MG PO CAPS: 500.0000 mg | ORAL_CAPSULE | Freq: Two times a day (BID) | ORAL | 0 refills | Status: AC

## 2023-11-26 MED ORDER — TETANUS-DIPHTH-ACELL PERTUSSIS 5-2.5-18.5 LF-MCG/0.5 IM SUSY
0.5000 mL | PREFILLED_SYRINGE | Freq: Once | INTRAMUSCULAR | Status: AC
  Administered 2023-11-26: 0.5 mL via INTRAMUSCULAR
  Filled 2023-11-26: qty 0.5

## 2023-11-26 NOTE — ED Provider Notes (Signed)
 [place] [facility] DEPARTMENT AT [facility] Provider Note   CSN: [id number] Arrival date & time: 11/26/23  1715     Patient presents with: Foreign Body in Skin   Sean Townsend is a 58 y.o. adult.   Patient is a 58 year old male with a history of GERD, hypertension, asthma, kidney stones who is presenting today after getting a nail stuck in his fifth left finger.  He was putting together a bookcase when the nail hit a knot and flew back and got lodged in his finger.  He was unable to pull it out at home.  Unsure when his last tetanus shot was.  Reports normal sensation in the finger  The history is provided by the patient.       Prior to Admission medications   Medication Sig Start Date End Date Taking? Authorizing Provider  cephALEXin  (KEFLEX ) 500 MG capsule Take 1 capsule (500 mg total) by mouth 2 (two) times daily. 11/26/23  Yes Doretha Folks, MD  Albuterol Sulfate 108 (90 Base) MCG/ACT AEPB Inhale into the lungs.    [provider]  amLODipine-benazepril (LOTREL) 5-20 MG capsule  09/11/16   [provider]  Cannabinoids (THC FREE PO)  04/18/22   [provider]  cetirizine (ZYRTEC) 10 MG tablet Take by mouth.    [provider]  FLUoxetine (PROZAC) 20 MG capsule  08/25/16   [provider]  fluticasone (FLONASE) 50 MCG/ACT nasal spray Place into the nose.    [provider]  gemfibrozil (LOPID) 600 MG tablet Take 600 mg by mouth 2 (two) times daily. 06/25/22   [provider]  methocarbamol (ROBAXIN) 750 MG tablet TAKE 1 TABLET BY MOUTH EVERY 8 HOURS AS NEEDED FOR MUSCLE TIGHTNESS 06/25/17   [provider]  Multiple Vitamins-Minerals (CENTRUM SILVER 50+MEN) TABS     [provider]  omeprazole (PRILOSEC) 40 MG capsule  05/10/17   [provider]  ondansetron  (ZOFRAN -ODT) 4 MG disintegrating tablet Take 1 tablet (4 mg total) by mouth every 8 (eight) hours as needed for nausea or  vomiting. 03/17/21   Sean Loge, PA-C  oxyCODONE -acetaminophen  (PERCOCET/ROXICET) 5-325 MG tablet Take 1 tablet by mouth every 6 (six) hours as needed for severe pain.    [provider]  Probiotic Product ([employer] DAILY PROBIOTIC) CAPS  04/18/22   [provider]  SEMAGLUTIDE,0.25 OR 0.5MG /DOS, [facility]  07/16/22   [provider]  tamsulosin (FLOMAX) 0.4 MG CAPS capsule Take 0.4 mg by mouth.    [provider]    Allergies: Niacin and Statins    Review of Systems  Updated Vital Signs BP (!) 176/113   Pulse 77   Temp 98.2 F (36.8 C)   Resp 18   SpO2 99%   Physical Exam Vitals reviewed.  HENT:     Head: Normocephalic.  Musculoskeletal:       Hands:  Neurological:     Mental Status: Sean Townsend is alert.     (all labs ordered are listed, but only abnormal results are displayed) Labs Reviewed - No data to display  EKG: None  Radiology: DG Hand Complete Left Result Date: 11/26/2023 CLINICAL DATA:  Status post trauma. EXAM: LEFT HAND - COMPLETE 3+ VIEW COMPARISON:  None Available. FINDINGS: A 7.2 cm long radiopaque nail is seen with its distal end extending through the fifth left finger, just below the middle phalanx. No acute fracture or dislocation is clearly identified. There is no evidence of arthropathy or other focal bone  abnormality. IMPRESSION: Radiopaque foreign body (i.e. nail) extending through the fifth left finger, as described above. Electronically Signed   By: Suzen Dials M.D.   On: 11/26/2023 17:47     .Foreign Body Removal  Date/Time: 11/26/2023 7:17 PM  Performed by: Doretha Folks, MD Authorized by: Doretha Folks, MD  Consent: Verbal consent obtained Consent given by: patient Imaging studies: imaging studies available Patient identity confirmed: verbally with patient Body area: skin General location: upper extremity Location details: left small finger Anesthesia: digital block  Anesthesia: Local Anesthetic: lidocaine   1% without epinephrine Anesthetic total: 4 mL  Sedation: Patient sedated: no  Patient restrained: no Patient cooperative: yes Localization method: visualized Removal mechanism: hemostat Dressing: dressing applied Tendon involvement: none Depth: subcutaneous Complexity: simple 1 objects recovered. Objects recovered: nail Post-procedure assessment: foreign body removed Patient tolerance: patient tolerated the procedure well with no immediate complications     Medications Ordered in the ED  lidocaine  (PF) (XYLOCAINE ) 1 % injection 5 mL (5 mLs Infiltration Given 11/26/23 1803)  Tdap (BOOSTRIX) injection 0.5 mL (0.5 mLs Intramuscular Given 11/26/23 1818)                                    Medical Decision Making Amount and/or Complexity of Data Reviewed Radiology: ordered and independent interpretation performed. Decision-making details documented in ED Course.  Risk OTC drugs. Prescription drug management.   Patient here with a nail in his finger.  Tetanus shot was updated. I have independently visualized and interpreted pt's images today.  X-ray shows no sign of bony injury and low suspicion for joint violation.  Radiology reports that there is a nail extending through the fifth left finger but no evidence of bony fragments . digital block was placed and nail was removed with needle drivers.  It initially bled and then bleeding stopped.  Puncture wound was irrigated with normal saline.  After nail removal patient was able to fully flex and extend at the DIP and PIP joint of the fifth left finger.      Final diagnoses:  Foreign body (FB) in soft tissue    ED Discharge Orders          Ordered    cephALEXin  (KEFLEX ) 500 MG capsule  2 times daily        11/26/23 [name] Doretha Folks, MD 11/26/23 1919

## 2023-11-26 NOTE — Progress Notes (Signed)
 Orthopedic Tech Progress Note Patient Details:  Dewitte Vannice 1965/11/09 [id number]  Ortho Devices Type of Ortho Device: Finger splint Ortho Device/Splint Location: left 5th Ortho Device/Splint Interventions: Ordered, Application, Adjustment   Post Interventions Patient Tolerated: Well Instructions Provided: Adjustment of device, Care of device  Waylan Thom Loving 11/26/2023, 6:29 PM

## 2023-11-26 NOTE — Discharge Instructions (Signed)
 You can wash the soap and water and then just place some Vaseline over the wound.  You received your tetanus shot today.  Also a prescription for some antibiotics was sent to the pharmacy due to concern for possible infection starting after this.  If you start having significant redness, swelling or drainage from the wound follow-up with hand surgery.

## 2023-11-26 NOTE — ED Triage Notes (Signed)
 Pt with nail through left small finger from nail gun. Bleeding controlled.
